# Patient Record
Sex: Female | Born: 1980 | Race: White | Hispanic: No | Marital: Single | Smoking: Never smoker
Health system: Southern US, Community
[De-identification: ages and names within clinical notes are randomized; demographics above are authoritative.]

---
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
## 2002-09-14 ENCOUNTER — Other Ambulatory Visit: Admission: RE | Admit: 2002-09-14 | Discharge: 2002-09-14 | Payer: Self-pay | Admitting: Obstetrics & Gynecology

## 2003-03-01 ENCOUNTER — Encounter: Payer: Self-pay | Admitting: Obstetrics & Gynecology

## 2003-03-01 ENCOUNTER — Ambulatory Visit (HOSPITAL_COMMUNITY): Admission: RE | Admit: 2003-03-01 | Discharge: 2003-03-01 | Payer: Self-pay | Admitting: Obstetrics & Gynecology

## 2003-03-02 ENCOUNTER — Other Ambulatory Visit: Admission: RE | Admit: 2003-03-02 | Discharge: 2003-03-02 | Payer: Self-pay | Admitting: Obstetrics & Gynecology

## 2003-04-08 ENCOUNTER — Ambulatory Visit (HOSPITAL_COMMUNITY): Admission: RE | Admit: 2003-04-08 | Discharge: 2003-04-08 | Payer: Self-pay | Admitting: Obstetrics & Gynecology

## 2003-04-08 ENCOUNTER — Encounter: Payer: Self-pay | Admitting: Obstetrics & Gynecology

## 2003-09-06 ENCOUNTER — Other Ambulatory Visit: Admission: RE | Admit: 2003-09-06 | Discharge: 2003-09-06 | Payer: Self-pay | Admitting: Obstetrics & Gynecology

## 2006-11-17 ENCOUNTER — Emergency Department (HOSPITAL_COMMUNITY): Admission: EM | Admit: 2006-11-17 | Discharge: 2006-11-17 | Payer: Self-pay | Admitting: Family Medicine

## 2007-02-20 ENCOUNTER — Emergency Department (HOSPITAL_COMMUNITY): Admission: EM | Admit: 2007-02-20 | Discharge: 2007-02-20 | Payer: Self-pay | Admitting: Emergency Medicine

## 2007-06-20 ENCOUNTER — Inpatient Hospital Stay (HOSPITAL_COMMUNITY): Admission: AD | Admit: 2007-06-20 | Discharge: 2007-06-20 | Payer: Self-pay | Admitting: Obstetrics & Gynecology

## 2007-07-10 ENCOUNTER — Inpatient Hospital Stay (HOSPITAL_COMMUNITY): Admission: AD | Admit: 2007-07-10 | Discharge: 2007-07-10 | Payer: Self-pay | Admitting: Obstetrics and Gynecology

## 2007-07-20 ENCOUNTER — Inpatient Hospital Stay (HOSPITAL_COMMUNITY): Admission: AD | Admit: 2007-07-20 | Discharge: 2007-07-20 | Payer: Self-pay | Admitting: Obstetrics and Gynecology

## 2007-08-06 ENCOUNTER — Inpatient Hospital Stay (HOSPITAL_COMMUNITY): Admission: AD | Admit: 2007-08-06 | Discharge: 2007-08-06 | Payer: Self-pay | Admitting: Obstetrics and Gynecology

## 2007-08-13 ENCOUNTER — Inpatient Hospital Stay (HOSPITAL_COMMUNITY): Admission: AD | Admit: 2007-08-13 | Discharge: 2007-08-15 | Payer: Self-pay | Admitting: Obstetrics and Gynecology

## 2007-08-20 ENCOUNTER — Inpatient Hospital Stay (HOSPITAL_COMMUNITY): Admission: AD | Admit: 2007-08-20 | Discharge: 2007-08-20 | Payer: Self-pay | Admitting: Obstetrics and Gynecology

## 2007-11-30 ENCOUNTER — Emergency Department (HOSPITAL_COMMUNITY): Admission: EM | Admit: 2007-11-30 | Discharge: 2007-11-30 | Payer: Self-pay | Admitting: Emergency Medicine

## 2008-06-06 ENCOUNTER — Emergency Department (HOSPITAL_COMMUNITY): Admission: EM | Admit: 2008-06-06 | Discharge: 2008-06-06 | Payer: Self-pay | Admitting: Emergency Medicine

## 2008-10-08 ENCOUNTER — Emergency Department (HOSPITAL_COMMUNITY): Admission: EM | Admit: 2008-10-08 | Discharge: 2008-10-08 | Payer: Self-pay | Admitting: Emergency Medicine

## 2010-07-10 ENCOUNTER — Emergency Department (HOSPITAL_COMMUNITY): Admission: EM | Admit: 2010-07-10 | Discharge: 2010-07-10 | Payer: Self-pay | Admitting: Family Medicine

## 2010-12-11 LAB — CBC
Hemoglobin: 14.3 g/dL (ref 12.0–15.0)
MCHC: 35.5 g/dL (ref 30.0–36.0)
MCV: 90.7 fL (ref 78.0–100.0)
RBC: 4.42 MIL/uL (ref 3.87–5.11)
WBC: 6 10*3/uL (ref 4.0–10.5)

## 2010-12-11 LAB — BASIC METABOLIC PANEL
CO2: 24 mEq/L (ref 19–32)
Chloride: 108 mEq/L (ref 96–112)
GFR calc Af Amer: >60 mL/min (ref >60)
Sodium: 138 mEq/L (ref 135–145)

## 2011-05-31 LAB — LACTATE DEHYDROGENASE: LDH: 239

## 2011-05-31 LAB — CBC
HCT: 25.9 — ABNORMAL LOW
HCT: 34.9 — ABNORMAL LOW
Hemoglobin: 12.1
Hemoglobin: 8.9 — ABNORMAL LOW
Hemoglobin: 9.4 — ABNORMAL LOW
MCV: 85.5
Platelets: 191
Platelets: 248
RDW: 14.4
WBC: 10.2
WBC: 9

## 2011-05-31 LAB — COMPREHENSIVE METABOLIC PANEL
Alkaline Phosphatase: 135 — ABNORMAL HIGH
BUN: 11
Chloride: 106
GFR calc non Af Amer: >60
Glucose, Bld: 90
Potassium: 3.8
Total Bilirubin: 0.6

## 2011-05-31 LAB — URINALYSIS, ROUTINE W REFLEX MICROSCOPIC
Ketones, ur: NEGATIVE
Nitrite: NEGATIVE
Urobilinogen, UA: 4 — ABNORMAL HIGH

## 2011-05-31 LAB — URIC ACID: Uric Acid, Serum: 6.5

## 2011-05-31 LAB — URINE MICROSCOPIC-ADD ON

## 2011-06-03 LAB — URINALYSIS, ROUTINE W REFLEX MICROSCOPIC
Glucose, UA: 100 — AB
Hgb urine dipstick: NEGATIVE
Specific Gravity, Urine: 1.025
Urobilinogen, UA: 0.2

## 2011-06-05 LAB — URINALYSIS, ROUTINE W REFLEX MICROSCOPIC
Glucose, UA: NEGATIVE
Nitrite: NEGATIVE
Protein, ur: NEGATIVE

## 2011-06-05 LAB — COMPREHENSIVE METABOLIC PANEL
ALT: 12
AST: 15
Albumin: 2.9 — ABNORMAL LOW
Alkaline Phosphatase: 106
BUN: 6
Chloride: 107
GFR calc Af Amer: >60
Potassium: 3.8
Sodium: 137
Total Bilirubin: 0.6

## 2011-06-05 LAB — CBC
HCT: 32.8 — ABNORMAL LOW
Platelets: 230
WBC: 10.9 — ABNORMAL HIGH

## 2011-06-12 LAB — CBC
HCT: 36.2
MCV: 89.7
Platelets: 214
RDW: 12.7

## 2011-06-12 LAB — DIFFERENTIAL
Basophils Absolute: 0
Lymphocytes Relative: 16
Monocytes Absolute: 0.6
Monocytes Relative: 5
Neutro Abs: 8.2 — ABNORMAL HIGH
Neutrophils Relative %: 78 — ABNORMAL HIGH

## 2011-06-12 LAB — COMPREHENSIVE METABOLIC PANEL
Albumin: 3.1 — ABNORMAL LOW
BUN: 6
Creatinine, Ser: 0.62
Glucose, Bld: 105 — ABNORMAL HIGH
Total Bilirubin: 0.3
Total Protein: 5.9 — ABNORMAL LOW

## 2011-06-12 LAB — LIPASE, BLOOD: Lipase: 14

## 2016-03-23 ENCOUNTER — Encounter: Payer: Self-pay | Admitting: Emergency Medicine

## 2016-03-23 ENCOUNTER — Emergency Department (INDEPENDENT_AMBULATORY_CARE_PROVIDER_SITE_OTHER)
Admission: EM | Admit: 2016-03-23 | Discharge: 2016-03-23 | Disposition: A | Discharge status: Home / Self Care | Payer: Medicaid Other | Source: Home / Self Care | Attending: Family Medicine | Admitting: Family Medicine

## 2016-03-23 DIAGNOSIS — L089 Local infection of the skin and subcutaneous tissue, unspecified: Secondary | ICD-10-CM

## 2016-03-23 DIAGNOSIS — L0889 Other specified local infections of the skin and subcutaneous tissue: Secondary | ICD-10-CM | POA: Diagnosis not present

## 2016-03-23 MED ORDER — MUPIROCIN 2 % EX OINT: TOPICAL_OINTMENT | CUTANEOUS | 0 refills | Status: DC

## 2016-03-23 MED ORDER — CEPHALEXIN 500 MG PO CAPS: 500.0000 mg | ORAL_CAPSULE | Freq: Two times a day (BID) | ORAL | 0 refills | Status: DC

## 2016-03-23 NOTE — Discharge Instructions (Signed)
°  It is very important to only use antibiotics as prescribed and to only use antibiotics prescribed directly to you.  You should never share antibiotics as this could prevent you, or there person you are sharing with, from getting the full course/dose of antibiotics, which could result in infection coming back, or not being fully treated, making it more difficult to treat in the future.

## 2016-03-23 NOTE — ED Provider Notes (Signed)
CSN: [id number]     Arrival date & time 03/23/16  1350 History   First MD Initiated Contact with Patient 03/23/16 1406     Chief Complaint  Patient presents with  . Recurrent Skin Infections   (Consider location/radiation/quality/duration/timing/severity/associated sxs/prior Treatment) HPI  Suzanne Cisneros is a 35 y.o. female presenting to UC with c/o a small pimple type lesion under her Right breast on her abdomen with surrounding redness. Area is mildly sore and has gradually worsened since yesterday.  She did use mupirocin on it but no relief. Denies bleeding or discharge. No fever, chills, n/v/d. No hx of having to have abscesses I&D, however, she has had similar sores on her skin.     History reviewed. No pertinent past medical history. History reviewed. No pertinent surgical history. History reviewed. No pertinent family history. Social History  Substance Use Topics  . Smoking status: Never Smoker  . Smokeless tobacco: Never Used  . Alcohol use No   OB History    No data available     Review of Systems  Constitutional: Negative for chills and fever.  Gastrointestinal: Negative for diarrhea and vomiting.  Musculoskeletal: Negative for joint swelling and myalgias.  Skin: Positive for color change and rash. Negative for wound.    Allergies  Review of patient's allergies indicates not on file.  Home Medications   Prior to Admission medications   Medication Sig Start Date End Date Taking? Authorizing Provider  esomeprazole (NEXIUM) 10 MG packet Take 10 mg by mouth daily before breakfast.   Yes Historical Provider, MD  oxyCODONE-acetaminophen (PERCOCET) 10-325 MG tablet Take 1 tablet by mouth every 4 (four) hours as needed for pain.   Yes Historical Provider, MD  cephALEXin (KEFLEX) 500 MG capsule Take 1 capsule (500 mg total) by mouth 2 (two) times daily. For 7 days 03/23/16   Junius Finner, PA-C  mupirocin ointment (BACTROBAN) 2 % Apply to lesion twice daily for 5 days  03/23/16   Junius Finner, PA-C   Meds Ordered and Administered this Visit  Medications - No data to display  BP 119/75 (BP Location: Right Arm)   Pulse 80   Temp 98.1 F (36.7 C) (Oral)   Wt 210 lb (95.3 kg)   LMP  (LMP Unknown) Comment: IUD  SpO2 98%  No data found.   Physical Exam  Constitutional: She is oriented to person, place, and time. She appears well-developed and well-nourished.  HENT:  Head: Normocephalic and atraumatic.  Eyes: EOM are normal.  Neck: Normal range of motion.  Cardiovascular: Normal rate.   Pulmonary/Chest: Effort normal.  Musculoskeletal: Normal range of motion.  Neurological: She is alert and oriented to person, place, and time.  Skin: Skin is warm and dry. There is erythema.  Right upper abdomen: 72mm pustule with 1cm area of surrounding erythema. Mildly tender. Faint red streaking toward Right breast. No bleeding, drainage, induration or fluctuance.   Psychiatric: She has a normal mood and affect. Her behavior is normal.  Nursing note and vitals reviewed.   Urgent Care Course   Clinical Course    Procedures (including critical care time)  Labs Review Labs Reviewed - No data to display  Imaging Review No results found.    MDM   1. Pustular lesion   2. Skin infection    Pt presenting with a small area of erythema and tenderness around a pustule. No indication for I&D at this time, however, due to tenderness and reports of redness worsening, will start on  oral Keflex.   Home care instructions provided. Encouraged warm compresses and to take antibiotics as prescribed.     Junius Finner, PA-C 03/23/16 1550

## 2016-03-23 NOTE — ED Triage Notes (Signed)
Pt c./o small red pimple under her right breast. States she noticed it yesterday and it is painful.

## 2016-07-26 ENCOUNTER — Emergency Department
Admission: EM | Admit: 2016-07-26 | Discharge: 2016-07-26 | Disposition: A | Discharge status: Home / Self Care | Payer: Medicaid Other | Source: Home / Self Care | Attending: Family Medicine | Admitting: Family Medicine

## 2016-07-26 ENCOUNTER — Encounter: Payer: Self-pay | Admitting: *Deleted

## 2016-07-26 DIAGNOSIS — L03311 Cellulitis of abdominal wall: Secondary | ICD-10-CM

## 2016-07-26 MED ORDER — CLINDAMYCIN HCL 300 MG PO CAPS: 300.0000 mg | ORAL_CAPSULE | Freq: Three times a day (TID) | ORAL | 0 refills | Status: AC

## 2016-07-26 MED ORDER — MUPIROCIN 2 % EX OINT: TOPICAL_OINTMENT | CUTANEOUS | 0 refills | Status: AC

## 2016-07-26 NOTE — ED Triage Notes (Signed)
Pt c/o abscess at the top of her pubic area x 6 days, worse x 2 days. She took Clindamycin and applied a cream her husband had x 2 days.

## 2016-07-26 NOTE — ED Provider Notes (Signed)
CSN: [id number]     Arrival date & time 07/26/16  1546 History   First MD Initiated Contact with Patient 07/26/16 1617     Chief Complaint  Patient presents with  . Abscess   (Consider location/radiation/quality/duration/timing/severity/associated sxs/prior Treatment) HPI Suzanne Cisneros is a 35 y.o. female presenting to UC with c/o abscess as the top of her pubic area for about 6 days, worse over the last 2 days. She notes the area started as a small bump she believed was an ingrown hair but noticed worsening pain, swelling, and redness. Pain is aching and sore, 8/10. Two days ago it finally opened up and drained but only drained a small amount. Hx of abscesses in the past and notes her husband gets sore frequently so he has been prescribed mupirocin ointment and clindamycin. Pt has been taking a few days of Clindamycin but trying to stretch it due to only having a few pills.  Denies fever, chills, n/v/d.    History reviewed. No pertinent past medical history. History reviewed. No pertinent surgical history. History reviewed. No pertinent family history. Social History  Substance Use Topics  . Smoking status: Never Smoker  . Smokeless tobacco: Never Used  . Alcohol use No   OB History    No data available     Review of Systems  Constitutional: Negative for chills and fever.  Gastrointestinal: Positive for abdominal pain (area of abscess, suprapubic region). Negative for diarrhea, nausea and vomiting.  Musculoskeletal: Negative for arthralgias and myalgias.  Skin: Positive for color change and wound. Negative for rash.    Allergies  Patient has no known allergies.  Home Medications   Prior to Admission medications   Medication Sig Start Date End Date Taking? Authorizing Provider  meloxicam (MOBIC) 7.5 MG tablet Take 7.5 mg by mouth daily.   Yes Historical Provider, MD  phentermine 37.5 MG capsule Take 37.5 mg by mouth every morning.   Yes Historical Provider, MD    clindamycin (CLEOCIN) 300 MG capsule Take 1 capsule (300 mg total) by mouth 3 (three) times daily. X 7 days 07/26/16   Junius FinnerErin O'Malley, PA-C  mupirocin ointment (BACTROBAN) 2 % Apply to sores 2-3 times daily for 5 days 07/26/16   Junius FinnerErin O'Malley, PA-C   Meds Ordered and Administered this Visit  Medications - No data to display  BP 119/82 (BP Location: Left Arm)   Pulse 87   Temp 98.3 F (36.8 C) (Oral)   Resp 16   Wt 195 lb (88.5 kg)   SpO2 100%  No data found.   Physical Exam  Constitutional: She is oriented to person, place, and time. She appears well-developed and well-nourished. No distress.  HENT:  Head: Normocephalic and atraumatic.  Eyes: EOM are normal.  Neck: Normal range of motion.  Cardiovascular: Normal rate.   Pulmonary/Chest: Effort normal.  Abdominal: Soft. She exhibits no distension. There is tenderness. There is no guarding.    2-3cm area of erythema, induration and tenderness with 0.25cm area of opened skin oozing a scant amount of yellow serosanguinous discharge.  No fluctuance.   Musculoskeletal: Normal range of motion.  Neurological: She is alert and oriented to person, place, and time.  Skin: Skin is warm and dry. She is not diaphoretic. There is erythema.  Psychiatric: She has a normal mood and affect. Her behavior is normal.  Nursing note and vitals reviewed.   Urgent Care Course   Clinical Course     Procedures (including critical care time)  Labs  Review Labs Reviewed - No data to display  Imaging Review No results found.    MDM   1. Cellulitis of abdominal wall    Pt presenting to UC with a sore on her abdomen that started to drain 2 days ago.  Exam c/w cellulitis. Will start on a full course of Clindamycin and prescribe pt her own mupirocin ointment to use. Pt care instructions provided. F/u in 3-4 days if not improving, sooner if worsening.     Junius Finnerrin O'Malley, PA-C 07/26/16 [phone]

## 2016-07-28 ENCOUNTER — Telehealth: Payer: Self-pay

## 2016-07-28 NOTE — Telephone Encounter (Signed)
Unable to leave message, mailbox is full.

## 2017-02-17 ENCOUNTER — Other Ambulatory Visit: Payer: Self-pay | Admitting: Neurosurgery

## 2017-02-17 DIAGNOSIS — M545 Low back pain: Principal | ICD-10-CM

## 2017-02-17 DIAGNOSIS — G8929 Other chronic pain: Secondary | ICD-10-CM

## 2017-02-24 ENCOUNTER — Ambulatory Visit (INDEPENDENT_AMBULATORY_CARE_PROVIDER_SITE_OTHER): Payer: Medicaid Other

## 2017-02-24 DIAGNOSIS — G8929 Other chronic pain: Secondary | ICD-10-CM

## 2017-02-24 DIAGNOSIS — M5127 Other intervertebral disc displacement, lumbosacral region: Secondary | ICD-10-CM

## 2017-02-24 DIAGNOSIS — M545 Low back pain: Principal | ICD-10-CM

## 2017-10-18 IMAGING — MR MR LUMBAR SPINE W/O CM
4 of 5 series · 26 of 48 positions shown · non-contrast
Comparison: None.

CLINICAL DATA: Low back pain for at least 10 years. No known
injury.

EXAM:
MRI LUMBAR SPINE WITHOUT CONTRAST
TECHNIQUE: Multiplanar, multisequence MR imaging of the lumbar spine was
performed. No intravenous contrast was administered.

[Series 2: T2 · sagittal · 4.0mm · 0.81mm/px · 6 of 15 slices shown (1 of 2)]
[im 1/15]
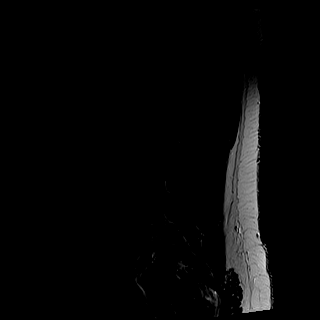
[im 3/15]
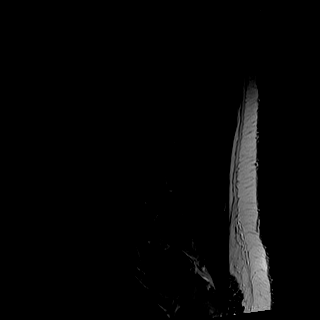
[im 6/15]
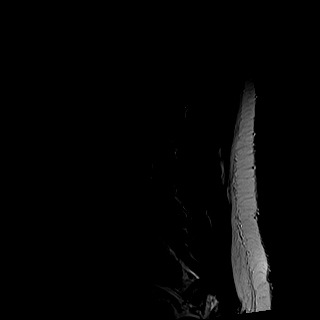
[im 9/15]
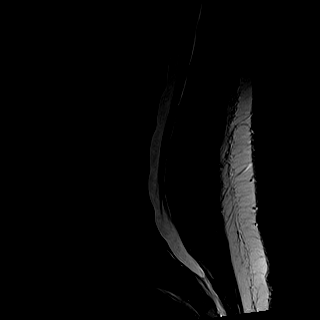
[im 12/15]
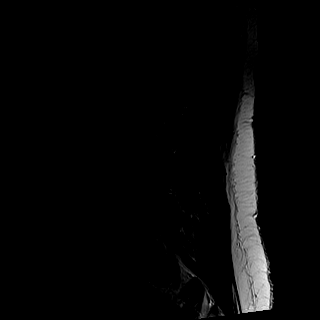
[im 15/15]
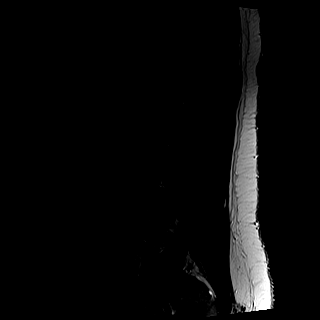

[Series 3: T1 · sagittal · 4.0mm · 0.41mm/px · 6 of 15 slices shown (1 of 2)]
[im 1/15]
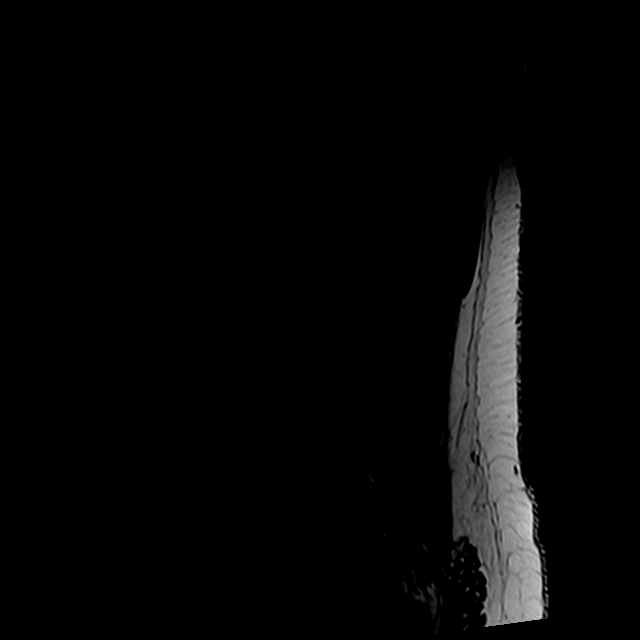
[im 3/15]
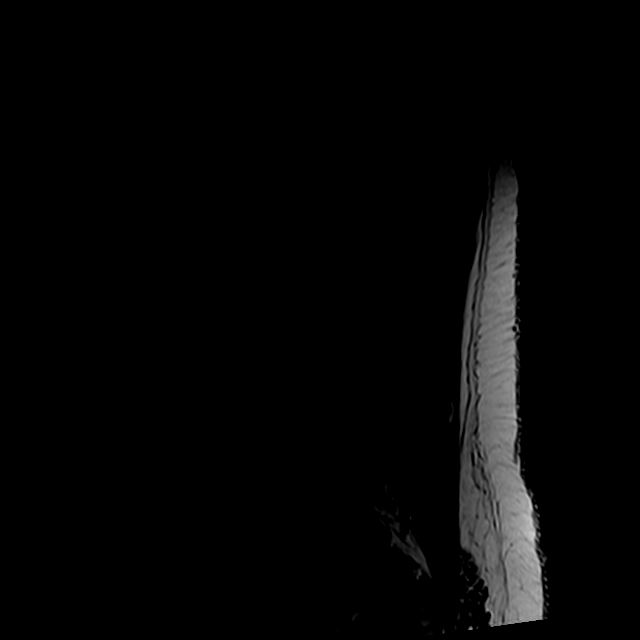
[im 6/15]
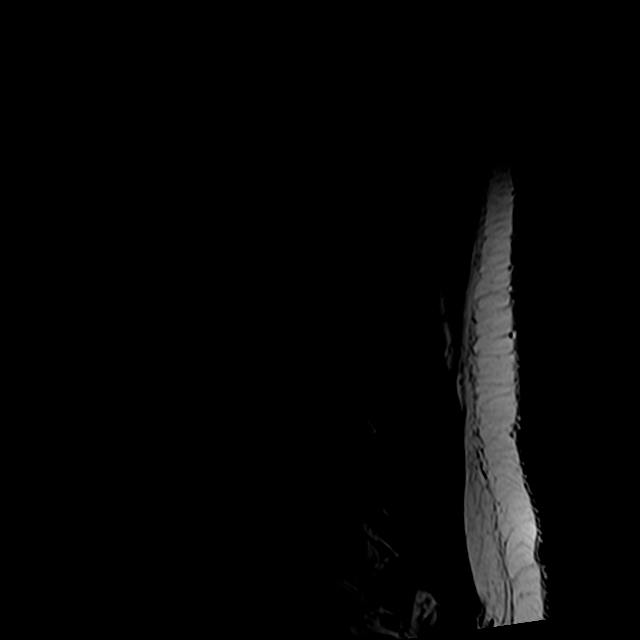
[im 9/15]
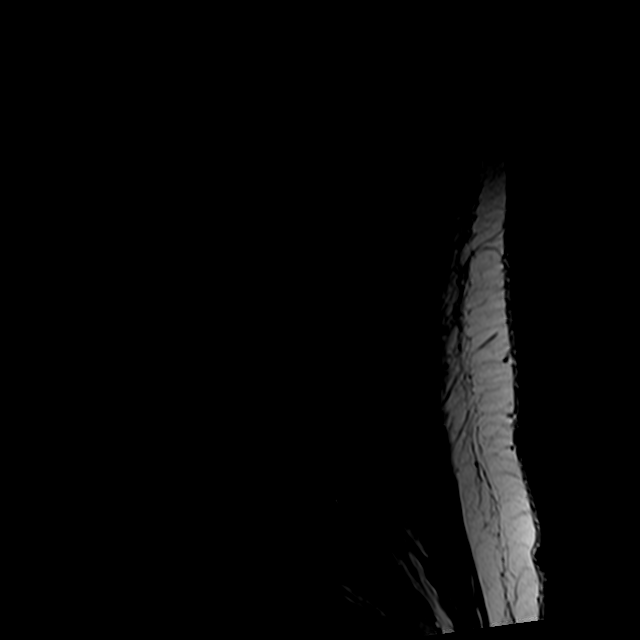
[im 12/15]
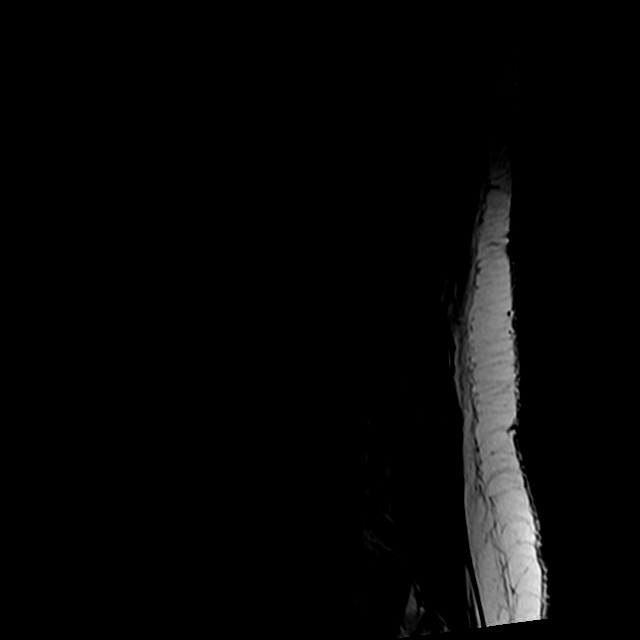
[im 15/15]
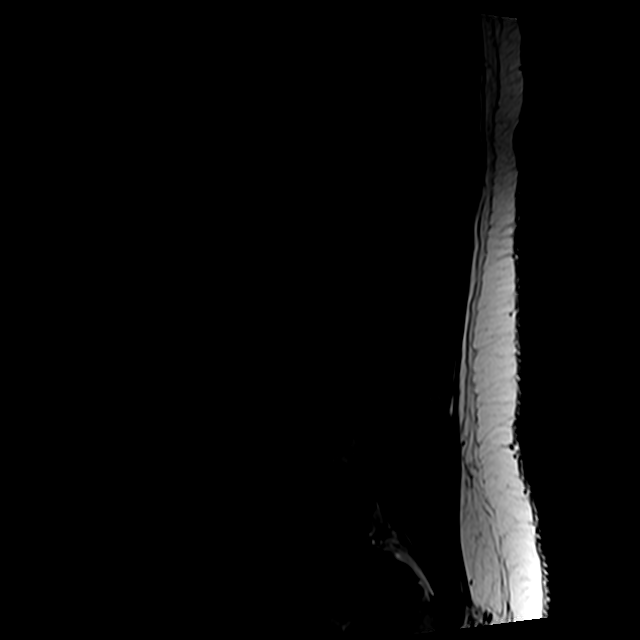

[Series 5: T2 · axial · 4.0mm · 0.78mm/px · z∈[-106,+115]mm · 9 of 37 slices shown (2 of 2)]
[im 1/37]
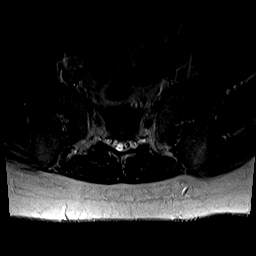
[im 6/37]
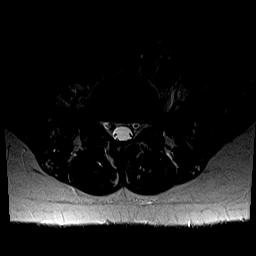
[im 11/37]
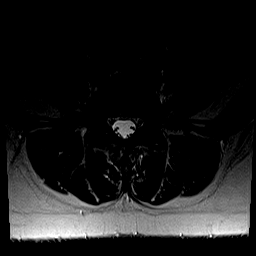
[im 16/37]
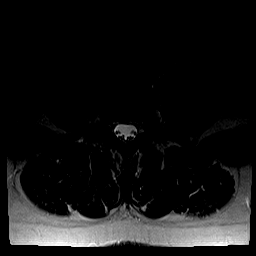
[im 19/37]
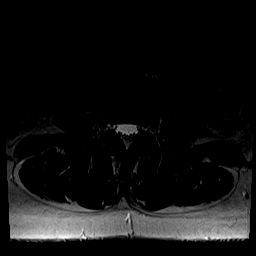
[im 21/37]
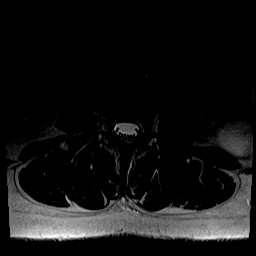
[im 26/37]
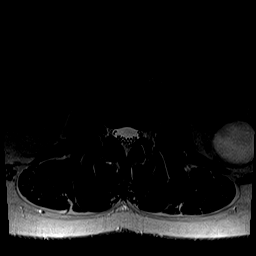
[im 31/37]
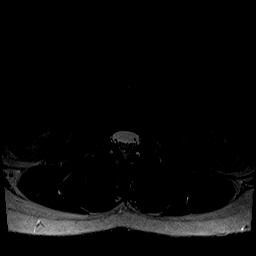
[im 37/37]
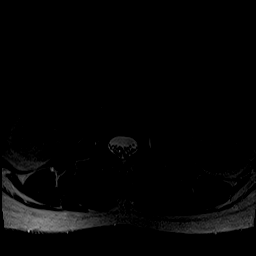

[Series 6: T1 · axial · 4.0mm · 0.39mm/px · z∈[-106,+85]mm · 5 of 37 slices shown (2 of 2)]
[im 1/37]
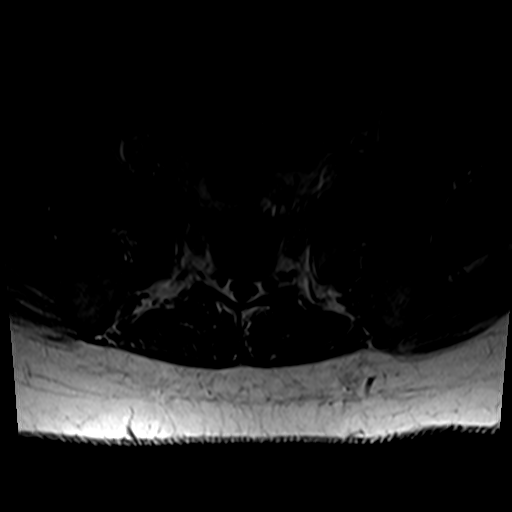
[im 6/37]
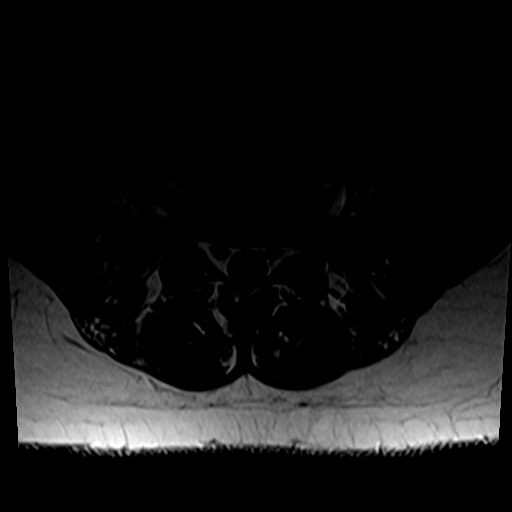
[im 11/37]
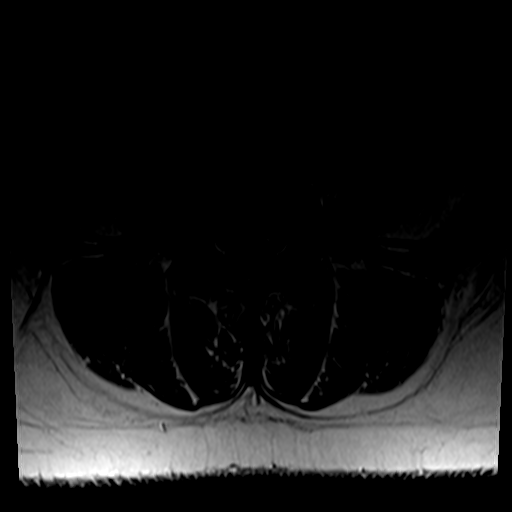
[im 19/37]
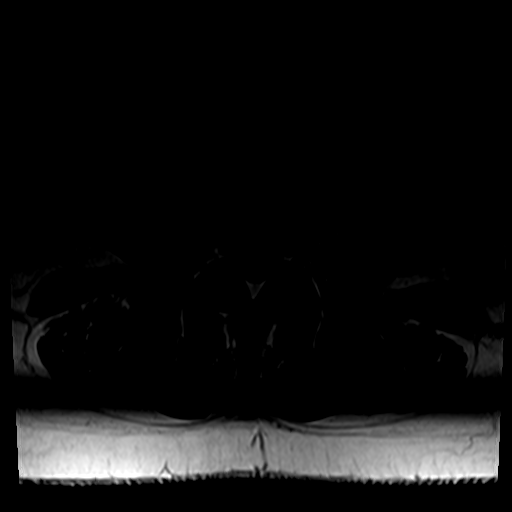
[im 31/37]
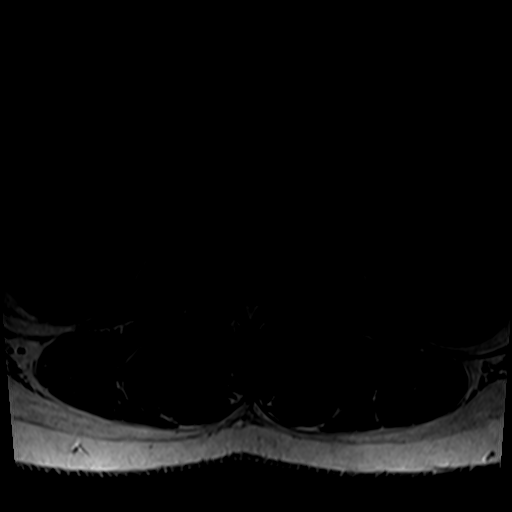

[26 of 48 positions shown; findings below may reference images not displayed]

FINDINGS: Segmentation:  Standard.

Alignment:  Normal.

Vertebrae: Height and signal are normal. No pars interarticularis
defect is identified.

Conus medullaris: Extends to the L1 level and appears normal.

Paraspinal and other soft tissues: 3.6 cm T2 hyperintense lesion
left kidney is most consistent with a cyst. Otherwise negative.

Disc levels:

T12-L1:  Negative.

L1-2:  Negative.

L2-3:  Negative.

L3-4: Minimal disc bulge without central canal or foraminal
stenosis.

L4-5: Mild facet degenerative change and a very shallow central
protrusion without central canal or foraminal stenosis.

L5-S1: Shallow disc bulge without central canal or foraminal
stenosis. 0.4 cm synovial cyst posterior to the left facet is
incidentally noted.
IMPRESSION: Mild lower lumbar facet degenerative change without central canal or
foraminal narrowing as described above.

## 2021-10-23 ENCOUNTER — Encounter (HOSPITAL_BASED_OUTPATIENT_CLINIC_OR_DEPARTMENT_OTHER): Payer: Medicaid Other | Admitting: Internal Medicine

## 2022-10-21 ENCOUNTER — Other Ambulatory Visit: Payer: Self-pay | Admitting: Family Medicine

## 2022-10-21 DIAGNOSIS — R600 Localized edema: Secondary | ICD-10-CM

## 2022-10-28 ENCOUNTER — Inpatient Hospital Stay: Admission: RE | Admit: 2022-10-28 | Payer: Medicaid Other | Source: Ambulatory Visit

## 2022-10-30 ENCOUNTER — Inpatient Hospital Stay: Admission: RE | Admit: 2022-10-30 | Payer: Medicaid Other | Source: Ambulatory Visit

## 2022-11-12 ENCOUNTER — Ambulatory Visit
Admission: RE | Admit: 2022-11-12 | Discharge: 2022-11-12 | Disposition: A | Discharge status: Home / Self Care | Payer: Medicaid Other | Source: Ambulatory Visit | Attending: Family Medicine | Admitting: Family Medicine

## 2022-11-12 DIAGNOSIS — R600 Localized edema: Secondary | ICD-10-CM

## 2022-11-12 MED ORDER — IOPAMIDOL (ISOVUE-370) INJECTION 76%
100.0000 mL | Freq: Once | INTRAVENOUS | Status: AC | PRN
  Administered 2022-11-12: 100 mL via INTRAVENOUS

## 2022-11-18 ENCOUNTER — Other Ambulatory Visit: Payer: Medicaid Other
